# Patient Record
Sex: Female | Born: 2000 | Hispanic: Yes | Marital: Single | Smoking: Never smoker
Health system: Southern US, Community
[De-identification: ages and names within clinical notes are randomized; demographics above are authoritative.]

---
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
## 2020-08-02 ENCOUNTER — Encounter: Payer: Self-pay | Admitting: Emergency Medicine

## 2020-08-02 ENCOUNTER — Other Ambulatory Visit: Payer: Self-pay

## 2020-08-02 DIAGNOSIS — Z20822 Contact with and (suspected) exposure to covid-19: Secondary | ICD-10-CM | POA: Insufficient documentation

## 2020-08-02 DIAGNOSIS — M5441 Lumbago with sciatica, right side: Secondary | ICD-10-CM | POA: Insufficient documentation

## 2020-08-02 LAB — URINALYSIS, COMPLETE (UACMP) WITH MICROSCOPIC
Bacteria, UA: NONE SEEN
Bilirubin Urine: NEGATIVE
Glucose, UA: NEGATIVE mg/dL
Hgb urine dipstick: NEGATIVE
Ketones, ur: 5 mg/dL — AB
Leukocytes,Ua: NEGATIVE
Nitrite: NEGATIVE
Protein, ur: NEGATIVE mg/dL
Specific Gravity, Urine: 1.034 — ABNORMAL HIGH (ref 1.005–1.030)
pH: 5 (ref 5.0–8.0)

## 2020-08-02 LAB — POC URINE PREG, ED: Preg Test, Ur: NEGATIVE

## 2020-08-02 NOTE — ED Triage Notes (Signed)
Patient ambulatory to triage with steady gait, without difficulty or distress noted; pt reports lower back pain radiating down rt leg for last few mos after "bending over"

## 2020-08-03 ENCOUNTER — Emergency Department: Payer: Self-pay

## 2020-08-03 ENCOUNTER — Emergency Department
Admission: EM | Admit: 2020-08-03 | Discharge: 2020-08-03 | Disposition: A | Discharge status: Home / Self Care | Payer: Self-pay | Attending: Emergency Medicine | Admitting: Emergency Medicine

## 2020-08-03 DIAGNOSIS — M545 Low back pain, unspecified: Secondary | ICD-10-CM

## 2020-08-03 LAB — CBC WITH DIFFERENTIAL/PLATELET
Abs Immature Granulocytes: 0.03 10*3/uL (ref 0.00–0.07)
Basophils Absolute: 0.1 10*3/uL (ref 0.0–0.1)
Basophils Relative: 1 %
Eosinophils Absolute: 0.1 10*3/uL (ref 0.0–0.5)
Eosinophils Relative: 1 %
HCT: 39.9 % (ref 36.0–46.0)
Hemoglobin: 13.7 g/dL (ref 12.0–15.0)
Immature Granulocytes: 0 %
Lymphocytes Relative: 32 %
Lymphs Abs: 3 10*3/uL (ref 0.7–4.0)
MCH: 30.9 pg (ref 26.0–34.0)
MCHC: 34.3 g/dL (ref 30.0–36.0)
MCV: 89.9 fL (ref 80.0–100.0)
Monocytes Absolute: 0.7 10*3/uL (ref 0.1–1.0)
Monocytes Relative: 8 %
Neutro Abs: 5.3 10*3/uL (ref 1.7–7.7)
Neutrophils Relative %: 58 %
Platelets: 303 10*3/uL (ref 150–400)
RBC: 4.44 MIL/uL (ref 3.87–5.11)
RDW: 12.6 % (ref 11.5–15.5)
WBC: 9.2 10*3/uL (ref 4.0–10.5)
nRBC: 0 % (ref 0.0–0.2)

## 2020-08-03 LAB — BASIC METABOLIC PANEL
Anion gap: 10 (ref 5–15)
BUN: 16 mg/dL (ref 6–20)
CO2: 25 mmol/L (ref 22–32)
Calcium: 10 mg/dL (ref 8.9–10.3)
Chloride: 101 mmol/L (ref 98–111)
Creatinine, Ser: 0.64 mg/dL (ref 0.44–1.00)
GFR, Estimated: >60 mL/min (ref >60)
Glucose, Bld: 109 mg/dL — ABNORMAL HIGH (ref 70–99)
Potassium: 3.7 mmol/L (ref 3.5–5.1)
Sodium: 136 mmol/L (ref 135–145)

## 2020-08-03 LAB — RESP PANEL BY RT-PCR (FLU A&B, COVID) ARPGX2
Influenza A by PCR: NEGATIVE
Influenza B by PCR: NEGATIVE
SARS Coronavirus 2 by RT PCR: NEGATIVE

## 2020-08-03 MED ORDER — METHOCARBAMOL 500 MG PO TABS: 500.0000 mg | ORAL_TABLET | Freq: Three times a day (TID) | ORAL | 0 refills | Status: AC | PRN

## 2020-08-03 MED ORDER — GADOBUTROL 1 MMOL/ML IV SOLN
7.5000 mL | Freq: Once | INTRAVENOUS | Status: AC | PRN
  Administered 2020-08-03: 7.5 mL via INTRAVENOUS

## 2020-08-03 MED ORDER — ONDANSETRON HCL 4 MG/2ML IJ SOLN
4.0000 mg | Freq: Once | INTRAMUSCULAR | Status: AC
  Administered 2020-08-03: 4 mg via INTRAVENOUS
  Filled 2020-08-03: qty 2

## 2020-08-03 MED ORDER — MORPHINE SULFATE (PF) 4 MG/ML IV SOLN
4.0000 mg | Freq: Once | INTRAVENOUS | Status: AC
  Administered 2020-08-03: 4 mg via INTRAVENOUS
  Filled 2020-08-03: qty 1

## 2020-08-03 MED ORDER — KETOROLAC TROMETHAMINE 30 MG/ML IJ SOLN
30.0000 mg | Freq: Once | INTRAMUSCULAR | Status: AC
  Administered 2020-08-03: 30 mg via INTRAVENOUS
  Filled 2020-08-03: qty 1

## 2020-08-03 MED ORDER — IBUPROFEN 800 MG PO TABS: 800.0000 mg | ORAL_TABLET | Freq: Three times a day (TID) | ORAL | 0 refills | Status: DC | PRN

## 2020-08-03 NOTE — Discharge Instructions (Addendum)
Your blood work, urine, COVID and flu swabs were reassuring today.  Your MRI did show some very mild degenerative changes in your lower back but no other acute abnormality.  Recommend close follow-up with your primary care doctor if symptoms continue.  You may alternate heat and ice to this area.  You may use Salonpas patches over-the-counter to help with discomfort.  We are also discharging you with muscle relaxers and anti-inflammatories which you may take as needed for pain control.

## 2020-08-03 NOTE — ED Provider Notes (Signed)
[facility] Emergency Department Provider Note  ____________________________________________   Event Date/Time   First MD Initiated Contact with Patient 08/03/20 0001     (approximate)  I have reviewed the triage vital signs and the nursing notes.   HISTORY  Chief Complaint Back Pain    HPI Kayla Wolfe is a 20 y.o. female with no significant past medical history who presents to the emergency department with complaints of sharp, severe right lower back pain that radiates down her right leg has been intermittent since May.  She denies any known injury.  States tonight pain was significantly worse and she had an episode of urinary incontinence when trying to get up out of bed.  She is not sure if this was due to pain but states she kept urinating and could not get it to stop.  No bowel incontinence.  States over the past 2 to 3 days she has had fever as high as 100.4.  She denies any other infectious symptoms.  No headache, neck pain or neck stiffness, chest pain or shortness of breath, nausea, vomiting or diarrhea, dysuria or hematuria, vaginal bleeding or discharge.  No tick bites.  Has had a mild cough.  Denies history of previous back surgeries or epidural injections.  No history of HIV, diabetes, cancer or IV drug abuse.  Pain worse with movement.  Has been taking ibuprofen with some relief intermittently.      History reviewed. No pertinent past medical history.  There are no problems to display for this patient.   History reviewed. No pertinent surgical history.  Prior to Admission medications   Medication Sig Start Date End Date Taking? Authorizing Provider  ibuprofen (ADVIL) 800 MG tablet Take 1 tablet (800 mg total) by mouth every 8 (eight) hours as needed for mild pain. 08/03/20  Yes Randle Shatzer, Layla Maw, DO  methocarbamol (ROBAXIN) 500 MG tablet Take 1 tablet (500 mg total) by mouth every 8 (eight) hours as needed for muscle spasms. 08/03/20  Yes  Dameon Soltis, Layla Maw, DO    Allergies Patient has no known allergies.  No family history on file.  Social History Social History   Tobacco Use   Smoking status: Never   Smokeless tobacco: Never  Vaping Use   Vaping Use: Never used  Substance Use Topics   Alcohol use: Never   Drug use: Never    Review of Systems Constitutional: + fever. Eyes: No visual changes. ENT: No sore throat. Cardiovascular: Denies chest pain. Respiratory: Denies shortness of breath. Gastrointestinal: No nausea, vomiting, diarrhea. Genitourinary: Negative for dysuria. Musculoskeletal: + for back pain. Skin: Negative for rash. Neurological: Negative for focal weakness or numbness.  ____________________________________________   PHYSICAL EXAM:  VITAL SIGNS: ED Triage Vitals  Enc Vitals Group     BP 08/02/20 2313 (!) 145/82     Pulse Rate 08/02/20 2313 (!) 110     Resp 08/02/20 2313 18     Temp 08/02/20 2313 97.9 F (36.6 C)     Temp Source 08/02/20 2313 Oral     SpO2 08/02/20 2313 99 %     Weight 08/02/20 2317 180 lb (81.6 kg)     Height 08/02/20 2317 5\' 1"  (1.549 m)     Head Circumference --      Peak Flow --      Pain Score 08/02/20 2317 8     Pain Loc --      Pain Edu? --      Excl.  in GC? --    CONSTITUTIONAL: Alert and oriented and responds appropriately to questions.  Appears uncomfortable, afebrile, nontoxic HEAD: Normocephalic EYES: Conjunctivae clear, pupils appear equal, EOM appear intact ENT: normal nose; moist mucous membranes NECK: Supple, normal ROM CARD: Regular and tachycardic; S1 and S2 appreciated; no murmurs, no clicks, no rubs, no gallops RESP: Normal chest excursion without splinting or tachypnea; breath sounds clear and equal bilaterally; no wheezes, no rhonchi, no rales, no hypoxia or respiratory distress, speaking full sentences ABD/GI: Normal bowel sounds; non-distended; soft, non-tender, no rebound, no guarding, no peritoneal signs, no hepatosplenomegaly BACK:  The back appears normal, tender to palpation over the mid lower lumbar spine as well as the right paraspinal muscles without redness, warmth, soft tissue swelling, ecchymosis, rash or other lesions EXT: Normal ROM in all joints; no deformity noted, no edema; no cyanosis SKIN: Normal color for age and race; warm; no rash on exposed skin NEURO: Moves all extremities equally, normal sensation diffusely, no saddle anesthesia, 2+ deep tendon reflexes in bilateral upper and lower extremities, no clonus PSYCH: The patient's mood and manner are appropriate.  ____________________________________________   LABS (all labs ordered are listed, but only abnormal results are displayed)  Labs Reviewed  URINALYSIS, COMPLETE (UACMP) WITH MICROSCOPIC - Abnormal; Notable for the following components:      Result Value   Color, Urine YELLOW (*)    APPearance HAZY (*)    Specific Gravity, Urine 1.034 (*)    Ketones, ur 5 (*)    All other components within normal limits  BASIC METABOLIC PANEL - Abnormal; Notable for the following components:   Glucose, Bld 109 (*)    All other components within normal limits  RESP PANEL BY RT-PCR (FLU A&B, COVID) ARPGX2  CBC WITH DIFFERENTIAL/PLATELET  POC URINE PREG, ED   ____________________________________________  EKG   ____________________________________________  RADIOLOGY I, Lilyann Gravelle, personally viewed and evaluated these images (plain radiographs) as part of my medical decision making, as well as reviewing the written report by the radiologist.  ED MD interpretation: No cauda equina.  No sign of infection.  Official radiology report(s): MR Lumbar Spine W Wo Contrast  Result Date: 08/03/2020 CLINICAL DATA:  Low back pain, cauda equina syndrome suspected Low back pain, infection suspected EXAM: MRI LUMBAR SPINE WITHOUT AND WITH CONTRAST TECHNIQUE: Multiplanar and multiecho pulse sequences of the lumbar spine were obtained without and with intravenous  contrast. CONTRAST:  7.81mL GADAVIST GADOBUTROL 1 MMOL/ML IV SOLN COMPARISON:  None. FINDINGS: Segmentation:  Standard. Alignment:  Physiologic. Vertebrae:  No fracture, evidence of discitis, or bone lesion. Conus medullaris and cauda equina: Conus extends to the L1-2 level. Conus and cauda equina appear normal. Paraspinal and other soft tissues: Negative Disc levels: At L4-5, there is a small central disc protrusion. No spinal canal or neural foraminal stenosis. At L5-S1, there is mild facet hypertrophy. The other lumbar disc levels are normal. No abnormal contrast enhancement. IMPRESSION: 1. No cauda equina compression. 2. Mild lower lumbar degenerative disc disease without spinal canal or neural foraminal stenosis. Electronically Signed   By: Deatra Robinson M.D.   On: 08/03/2020 01:28    ____________________________________________   PROCEDURES  Procedure(s) performed (including Critical Care):  Procedures    ____________________________________________   INITIAL IMPRESSION / ASSESSMENT AND PLAN / ED COURSE  As part of my medical decision making, I reviewed the following data within the electronic MEDICAL RECORD NUMBER Nursing notes reviewed and incorporated, Labs reviewed , Old chart reviewed, Radiograph reviewed ,  and Notes from prior ED visits         Patient here with complaints of back pain.  Likely radiculopathy but patient states she has had fever for the past 2 to 3 days as well as an episode of urinary incontinence tonight although it does not sound like overflow incontinence.  Will obtain MRI of the lumbar spine with and without contrast to evaluate for possible cauda equina, spinal stenosis, epidural abscess or hematoma, discitis or osteomyelitis.  Will give pain medication.  Will obtain labs, COVID swab.  Urine unremarkable.  Urine pregnancy test negative.  Doubt UTI, pyelonephritis, kidney stone.  ED PROGRESS  Patient's labs reassuring today.  COVID and flu negative.  MRI shows  mild degenerative changes but no spinal stenosis, cauda equina, epidural abscess or hematoma, discitis or osteomyelitis, foraminal stenosis.  Will discharge with ibuprofen, Robaxin.  She does have a PCP for follow-up.  Patient reports feeling better and is comfortable with this plan.  Will provide with work note.  At this time, I do not feel there is any life-threatening condition present. I have reviewed, interpreted and discussed all results (EKG, imaging, lab, urine as appropriate) and exam findings with patient/family. I have reviewed nursing notes and appropriate previous records.  I feel the patient is safe to be discharged home without further emergent workup and can continue workup as an outpatient as needed. Discussed usual and customary return precautions. Patient/family verbalize understanding and are comfortable with this plan.  Outpatient follow-up has been provided as needed. All questions have been answered.  ____________________________________________   FINAL CLINICAL IMPRESSION(S) / ED DIAGNOSES  Final diagnoses:  Low back pain radiating to right lower extremity     ED Discharge Orders          Ordered    methocarbamol (ROBAXIN) 500 MG tablet  Every 8 hours PRN        08/03/20 0211    ibuprofen (ADVIL) 800 MG tablet  Every 8 hours PRN        08/03/20 0211            *Please note:  Luanna Cole A Mardi Mainland was evaluated in Emergency Department on 08/03/2020 for the symptoms described in the history of present illness. She was evaluated in the context of the global COVID-19 pandemic, which necessitated consideration that the patient might be at risk for infection with the SARS-CoV-2 virus that causes COVID-19. Institutional protocols and algorithms that pertain to the evaluation of patients at risk for COVID-19 are in a state of rapid change based on information released by regulatory bodies including the CDC and federal and state organizations. These policies and algorithms  were followed during the patient's care in the ED.  Some ED evaluations and interventions may be delayed as a result of limited staffing during and the pandemic.*   Note:  This document was prepared using Dragon voice recognition software and may include unintentional dictation errors.    Mycal Conde, Layla Maw, DO 08/03/20 [phone]

## 2020-11-30 ENCOUNTER — Ambulatory Visit
Admission: EM | Admit: 2020-11-30 | Discharge: 2020-11-30 | Disposition: A | Discharge status: Home / Self Care | Payer: Self-pay | Attending: Emergency Medicine | Admitting: Emergency Medicine

## 2020-11-30 ENCOUNTER — Encounter: Payer: Self-pay | Admitting: Emergency Medicine

## 2020-11-30 ENCOUNTER — Other Ambulatory Visit: Payer: Self-pay

## 2020-11-30 DIAGNOSIS — J069 Acute upper respiratory infection, unspecified: Secondary | ICD-10-CM | POA: Insufficient documentation

## 2020-11-30 LAB — RAPID INFLUENZA A&B ANTIGENS
Influenza A (ARMC): NEGATIVE
Influenza B (ARMC): NEGATIVE

## 2020-11-30 MED ORDER — CETIRIZINE HCL 10 MG PO TABS: 10.0000 mg | ORAL_TABLET | Freq: Every day | ORAL | 0 refills | Status: AC

## 2020-11-30 MED ORDER — IBUPROFEN 800 MG PO TABS: 800.0000 mg | ORAL_TABLET | Freq: Three times a day (TID) | ORAL | 0 refills | Status: AC

## 2020-11-30 MED ORDER — LIDOCAINE VISCOUS HCL 2 % MT SOLN
15.0000 mL | OROMUCOSAL | 0 refills | Status: AC | PRN
Start: 2020-11-30 — End: ?

## 2020-11-30 NOTE — ED Provider Notes (Signed)
[facility]    CSN: [id number] Arrival date & time: 11/30/20  1331      History   Chief Complaint Chief Complaint  Patient presents with   Headache   Fever   Cough    HPI Kayla Wolfe is a 20 y.o. female.   Patient presents with fever, nasal congestion, rhinorrhea, sore throat, nonproductive cough and intermittent generalized headaches for day 4. Poor appetite, tolerating fluid. 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 Has attempted use of dayquil, tylenol, minimal relief.  Denies ear pain, abdominal pain, nausea, vomiting, diarrhea, shortness of breath, wheezing.     History reviewed. No pertinent past medical history.  There are no problems to display for this patient.   History reviewed. No pertinent surgical history.  OB History   No obstetric history on file.      Home Medications    Prior to Admission medications   Medication Sig Start Date End Date Taking? Authorizing Provider  ibuprofen (ADVIL) 800 MG tablet Take 1 tablet (800 mg total) by mouth every 8 (eight) hours as needed for mild pain. 08/03/20   Ward, Layla Maw, DO  methocarbamol (ROBAXIN) 500 MG tablet Take 1 tablet (500 mg total) by mouth every 8 (eight) hours as needed for muscle spasms. 08/03/20   Ward, Layla Maw, DO    Family History History reviewed. No pertinent family history.  Social History Social History   Tobacco Use   Smoking status: Never   Smokeless tobacco: Never  Vaping Use   Vaping Use: Never used  Substance Use Topics   Alcohol use: Never   Drug use: Never     Allergies   Patient has no known allergies.   Review of Systems Review of Systems  Constitutional:  Positive for appetite change and fever. Negative for activity change, chills, diaphoresis, fatigue and unexpected weight change.  HENT:  Positive for congestion, rhinorrhea and sore throat. Negative for dental problem, drooling, ear discharge, ear pain, facial swelling, hearing loss,  mouth sores, nosebleeds, postnasal drip, sinus pressure, sinus pain, sneezing, tinnitus, trouble swallowing and voice change.   Respiratory:  Positive for cough. Negative for apnea, choking, chest tightness, shortness of breath, wheezing and stridor.   Cardiovascular: Negative.   Gastrointestinal: Negative.   Skin: Negative.   Neurological:  Positive for headaches. Negative for dizziness, tremors, seizures, syncope, facial asymmetry, speech difficulty, weakness, light-headedness and numbness.    Physical Exam Triage Vital Signs ED Triage Vitals  Enc Vitals Group     BP 11/30/20 1435 136/74     Pulse Rate 11/30/20 1435 73     Resp 11/30/20 1435 16     Temp 11/30/20 1435 98.4 F (36.9 C)     Temp Source 11/30/20 1435 Oral     SpO2 11/30/20 1435 100 %     Weight --      Height --      Head Circumference --      Peak Flow --      Pain Score 11/30/20 1436 5     Pain Loc --      Pain Edu? --      Excl. in [place]? --    No data found.  Updated Vital Signs BP 136/74 (BP Location: Left Arm)   Pulse 73   Temp 98.4 F (36.9 C) (Oral)   Resp 16   LMP 11/07/2020   SpO2 100%   Visual Acuity Right Eye Distance:   Left Eye Distance:   Bilateral  Distance:    Right Eye Near:   Left Eye Near:    Bilateral Near:     Physical Exam Constitutional:      Appearance: Normal appearance. She is normal weight.  HENT:     Head: Normocephalic.     Right Ear: Tympanic membrane, ear canal and external ear normal.     Left Ear: Tympanic membrane, ear canal and external ear normal.     Nose: Congestion and rhinorrhea present.     Mouth/Throat:     Mouth: Mucous membranes are moist.     Pharynx: Posterior oropharyngeal erythema present.  Eyes:     Extraocular Movements: Extraocular movements intact.  Cardiovascular:     Rate and Rhythm: Normal rate and regular rhythm.     Pulses: Normal pulses.     Heart sounds: Normal heart sounds.  Pulmonary:     Effort: Pulmonary effort is normal.      Breath sounds: Normal breath sounds.  Musculoskeletal:     Cervical back: Normal range of motion.  Lymphadenopathy:     Cervical: Cervical adenopathy present.  Skin:    General: Skin is warm and dry.  Neurological:     Mental Status: She is alert and oriented to person, place, and time. Mental status is at baseline.  Psychiatric:        Mood and Affect: Mood normal.        Behavior: Behavior normal.     UC Treatments / Results  Labs (all labs ordered are listed, but only abnormal results are displayed) Labs Reviewed - No data to display  EKG   Radiology No results found.  Procedures Procedures (including critical care time)  Medications Ordered in UC Medications - No data to display  Initial Impression / Assessment and Plan / UC Course  I have reviewed the triage vital signs and the nursing notes.  Pertinent labs & imaging results that were available during my care of the patient were reviewed by me and considered in my medical decision making (see chart for details).  Viral URI with cough  1.  Flu test pending 2.  Ibuprofen 800 mg 3 times daily as needed 3.Cetirizine 10 mg daily 4.  Lidocaine viscous 2% 15 mils every 4 hours as needed 5.  Over-the-counter medication management for remaining symptom management 6.  Work note given 7.  Urgent care follow-up as needed Final Clinical Impressions(s) / UC Diagnoses   Final diagnoses:  None   Discharge Instructions   None    ED Prescriptions   None    PDMP not reviewed this encounter.   Valinda Hoar, NP 11/30/20 1544

## 2020-11-30 NOTE — ED Triage Notes (Signed)
Headache, cough, fever, sore throat x 6 days

## 2020-11-30 NOTE — Discharge Instructions (Signed)
We will contact you if your flu test is positive.  Please quarantine while you wait for the results.  If your test is negative you may resume normal activities.  If your test is positive please continue to quarantine until you are without a fever for at least 24 hours after the medications.    You can take Tylenol and/or Ibuprofen as needed for fever reduction and pain relief.   For cough: honey 1/2 to 1 teaspoon (you can dilute the honey in water or another fluid).  You can also use guaifenesin and dextromethorphan for cough. You can use a humidifier for chest congestion and cough.  If you don't have a humidifier, you can sit in the bathroom with the hot shower running.      For sore throat: try warm salt water gargles, cepacol lozenges, throat spray, warm tea or water with lemon/honey, popsicles or ice, or OTC cold relief medicine for throat discomfort.   For congestion: take a daily anti-histamine like Zyrtec, Claritin, and a oral decongestant, such as pseudoephedrine.  You can also use Flonase 1-2 sprays in each nostril daily.   It is important to stay hydrated: drink plenty of fluids (water, gatorade/powerade/pedialyte, juices, or teas) to keep your throat moisturized and help further relieve irritation/discomfort.   

## 2021-01-15 ENCOUNTER — Encounter: Payer: Self-pay | Admitting: Emergency Medicine

## 2021-01-15 ENCOUNTER — Other Ambulatory Visit: Payer: Self-pay

## 2021-01-15 ENCOUNTER — Ambulatory Visit
Admission: EM | Admit: 2021-01-15 | Discharge: 2021-01-15 | Disposition: A | Discharge status: Home / Self Care | Payer: Self-pay | Attending: Physician Assistant | Admitting: Physician Assistant

## 2021-01-15 DIAGNOSIS — R110 Nausea: Secondary | ICD-10-CM | POA: Insufficient documentation

## 2021-01-15 LAB — PREGNANCY, URINE: Preg Test, Ur: NEGATIVE

## 2021-01-15 LAB — HCG, QUANTITATIVE, PREGNANCY: hCG, Beta Chain, Quant, S: <1 m[IU]/mL (ref <5)

## 2021-01-15 MED ORDER — ONDANSETRON HCL 4 MG PO TABS: 4.0000 mg | ORAL_TABLET | Freq: Four times a day (QID) | ORAL | 0 refills | Status: AC

## 2021-01-15 NOTE — ED Provider Notes (Signed)
[facility]    CSN: [id number] Arrival date & time: 01/15/21  1246      History   Chief Complaint Chief Complaint  Patient presents with   Nausea    HPI Mina Batdorf Fabiola Corti is a 21 y.o. female.   Patient states that she has had an irregular period for the past month.  She has had nausea for 3 weeks now and wanted to have a pregnancy test completed.  Patient took several COVID test thinking she may have gotten illness which all 3 came back negative.  Patient denies any cough congestion no abdominal pain no fevers.  She was wondering if maybe she could be early with pregnancy if the test is negative.  Patient also states that her bowel movements are not normal and she is not sure if this is a cause or not.   History reviewed. No pertinent past medical history.  There are no problems to display for this patient.   History reviewed. No pertinent surgical history.  OB History   No obstetric history on file.      Home Medications    Prior to Admission medications   Medication Sig Start Date End Date Taking? Authorizing Provider  ondansetron (ZOFRAN) 4 MG tablet Take 1 tablet (4 mg total) by mouth every 6 (six) hours. 01/15/21  Yes Marney Setting, NP  cetirizine (ZYRTEC ALLERGY) 10 MG tablet Take 1 tablet (10 mg total) by mouth daily. 11/30/20   White, Leitha Schuller, NP  ibuprofen (ADVIL) 800 MG tablet Take 1 tablet (800 mg total) by mouth 3 (three) times daily. 11/30/20   White, Leitha Schuller, NP  lidocaine (XYLOCAINE) 2 % solution Use as directed 15 mLs in the mouth or throat as needed for mouth pain. 11/30/20   White, Leitha Schuller, NP  methocarbamol (ROBAXIN) 500 MG tablet Take 1 tablet (500 mg total) by mouth every 8 (eight) hours as needed for muscle spasms. 08/03/20   Ward, Delice Bison, DO    Family History No family history on file.  Social History Social History   Tobacco Use   Smoking status: Never   Smokeless tobacco: Never  Vaping Use   Vaping Use:  Never used  Substance Use Topics   Alcohol use: Never   Drug use: Never     Allergies   Patient has no known allergies.   Review of Systems Review of Systems  Constitutional:  Negative for fatigue and fever.  Eyes: Negative.   Respiratory: Negative.    Cardiovascular: Negative.   Gastrointestinal:  Positive for constipation and nausea. Negative for abdominal pain, diarrhea, rectal pain and vomiting.  Genitourinary:  Positive for menstrual problem. Negative for difficulty urinating, dysuria, flank pain, frequency and hematuria.       Menstrual cycle only lasted for 2 days normal is approximately 5 days  Neurological: Negative.     Physical Exam Triage Vital Signs ED Triage Vitals  Enc Vitals Group     BP 01/15/21 1353 (!) 137/95     Pulse Rate 01/15/21 1353 73     Resp 01/15/21 1353 16     Temp 01/15/21 1353 99.1 F (37.3 C)     Temp Source 01/15/21 1353 Oral     SpO2 01/15/21 1353 100 %     Weight --      Height --      Head Circumference --      Peak Flow --      Pain Score 01/15/21 1352 0  Pain Loc --      Pain Edu? --      Excl. in [place]? --    No data found.  Updated Vital Signs BP (!) 137/95    Pulse 73    Temp 99.1 F (37.3 C) (Oral)    Resp 16    LMP 01/10/2021    SpO2 100%   Visual Acuity Right Eye Distance:   Left Eye Distance:   Bilateral Distance:    Right Eye Near:   Left Eye Near:    Bilateral Near:     Physical Exam Constitutional:      Appearance: Normal appearance.  Cardiovascular:     Rate and Rhythm: Normal rate.  Pulmonary:     Effort: Pulmonary effort is normal.  Abdominal:     General: Abdomen is flat.     Tenderness: There is no right CVA tenderness or left CVA tenderness.  Musculoskeletal:        General: Normal range of motion.  Skin:    General: Skin is warm.     Capillary Refill: Capillary refill takes less than 2 seconds.  Neurological:     General: No focal deficit present.     Mental Status: She is alert.      UC Treatments / Results  Labs (all labs ordered are listed, but only abnormal results are displayed) Labs Reviewed  PREGNANCY, URINE  HCG, QUANTITATIVE, PREGNANCY    EKG   Radiology No results found.  Procedures Procedures (including critical care time)  Medications Ordered in UC Medications - No data to display  Initial Impression / Assessment and Plan / UC Course  I have reviewed the triage vital signs and the nursing notes.  Pertinent labs & imaging results that were available during my care of the patient were reviewed by me and considered in my medical decision making (see chart for details).     Discussed with patient that should try a Dulcolax to ensure that constipation is not the cause of the nausea. We will send off an hCG G level just to rule out that it is not an early pregnancy. Take nausea medicine as needed if this continues he will need to follow-up with a gastro enterologist. Avoid spicy foods or caffeine which can irritate the stomach even more Your urine pregnancy was negative Final Clinical Impressions(s) / UC Diagnoses   Final diagnoses:  Nausea     Discharge Instructions      Discussed with patient that should try a Dulcolax to ensure that constipation is not the cause of the nausea. We will send off an hCG G level just to rule out that it is not an early pregnancy. Take nausea medicine as needed if this continues he will need to follow-up with a gastro enterologist. Avoid spicy foods or caffeine which can irritate the stomach even more     ED Prescriptions     Medication Sig Dispense Auth. Provider   ondansetron (ZOFRAN) 4 MG tablet Take 1 tablet (4 mg total) by mouth every 6 (six) hours. 12 tablet Marney Setting, NP      PDMP not reviewed this encounter.   Marney Setting, NP 01/15/21 1430

## 2021-01-15 NOTE — Discharge Instructions (Addendum)
Discussed with patient that should try a Dulcolax to ensure that constipation is not the cause of the nausea. We will send off an hCG G level just to rule out that it is not an early pregnancy. Take nausea medicine as needed if this continues he will need to follow-up with a gastro enterologist. Avoid spicy foods or caffeine which can irritate the stomach even more

## 2021-01-15 NOTE — ED Triage Notes (Signed)
PT reports nausea for 3 weeks. She had a negative pregnancy test 1 week ago.  Also had negative covid.   Notes that her last menstrual cycle lasted for 2 days, which is very unusual for her.

## 2022-11-27 IMAGING — MR MR LUMBAR SPINE WO/W CM
6 of 7 series · 31 of 48 positions shown · IV contrast (7.5ml Gadavist)
Comparison: None.

CLINICAL DATA: Low back pain, cauda equina syndrome suspected Low
back pain, infection suspected

EXAM:
MRI LUMBAR SPINE WITHOUT AND WITH CONTRAST
TECHNIQUE: Multiplanar and multiecho pulse sequences of the lumbar spine were
obtained without and with intravenous contrast.
CONTRAST:  7.5mL GADAVIST GADOBUTROL 1 MMOL/ML IV SOLN

[Series 9: T2 · sagittal · 4.0mm · 0.81mm/px · 4 of 17 slices shown (1 of 2)]
[im 1/17]
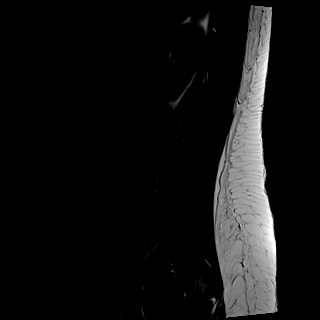
[im 6/17]
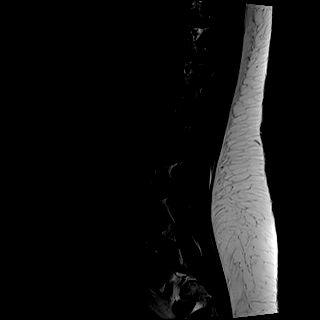
[im 11/17]
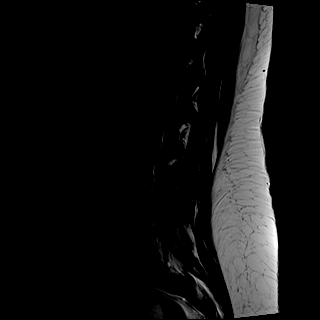
[im 17/17]
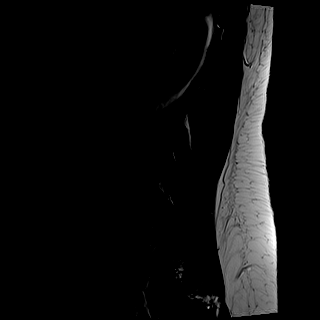

[Series 10: T1 · sagittal · 4.0mm · 0.81mm/px · 4 of 17 slices shown (1 of 2)]
[im 1/17]
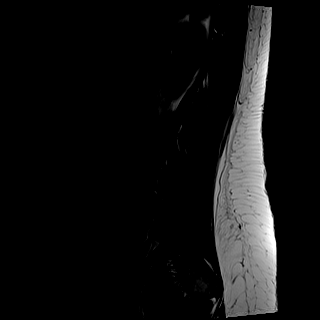
[im 6/17]
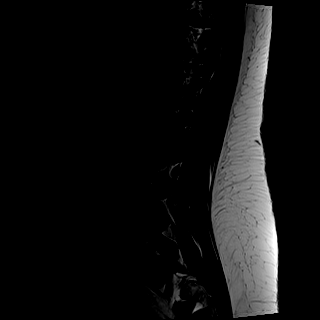
[im 11/17]
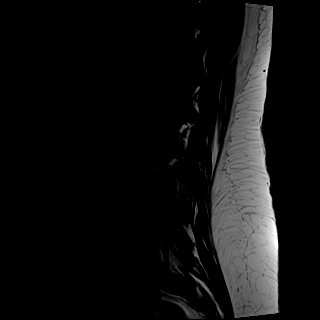
[im 17/17]
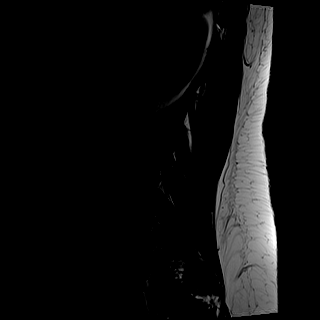

[Series 11: STIR · sagittal · 4.0mm · 0.41mm/px · 2 of 17 slices shown]
[im 1/17]
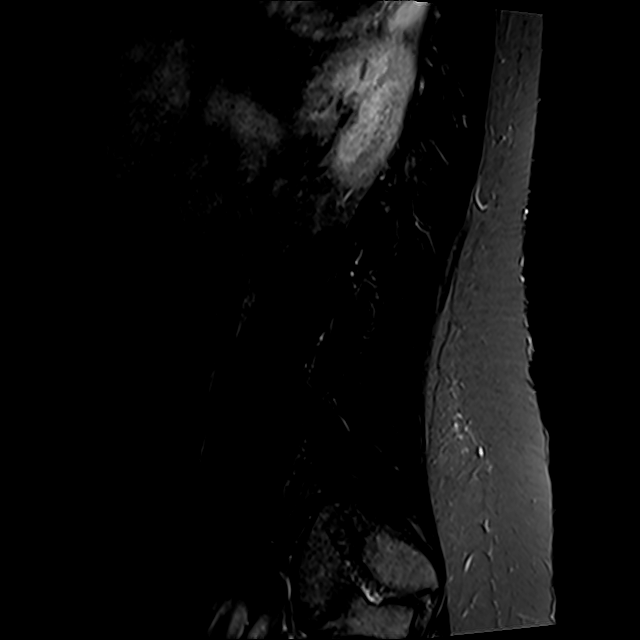
[im 5/17]
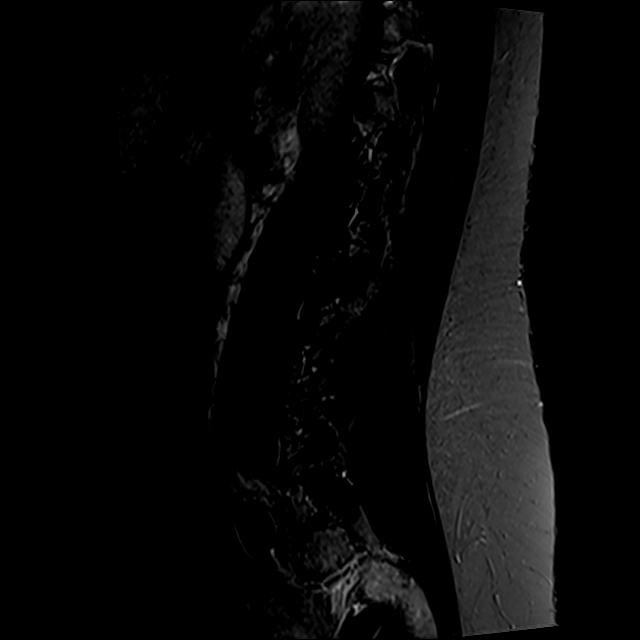

[Series 12: T2 · axial · 4.0mm · 0.78mm/px · z∈[-23,+175]mm · 8 of 36 slices shown (2 of 2)]
[im 1/36]
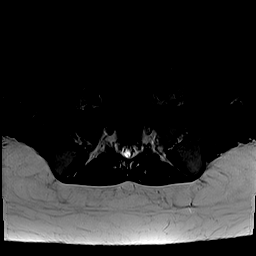
[im 4/36]
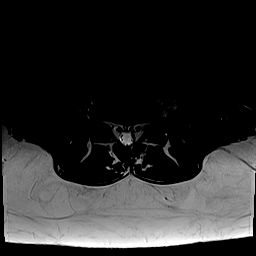
[im 12/36]
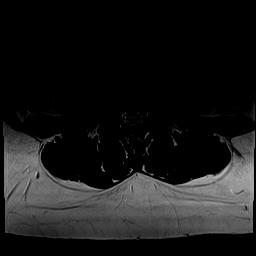
[im 16/36]
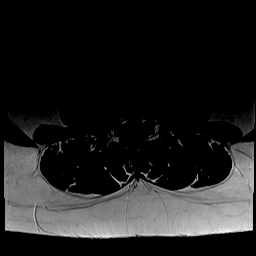
[im 20/36]
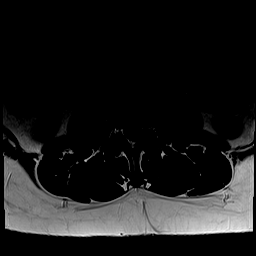
[im 24/36]
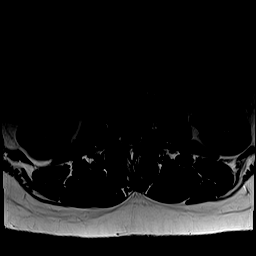
[im 32/36]
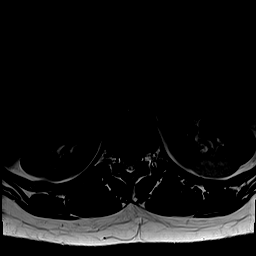
[im 36/36]
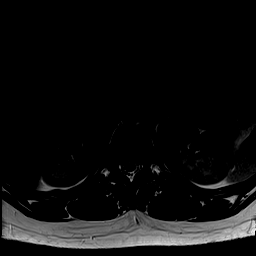

[Series 13: T1 · axial · 4.0mm · 0.39mm/px · z∈[-23,+175]mm · 8 of 36 slices shown (2 of 2)]
[im 1/36]
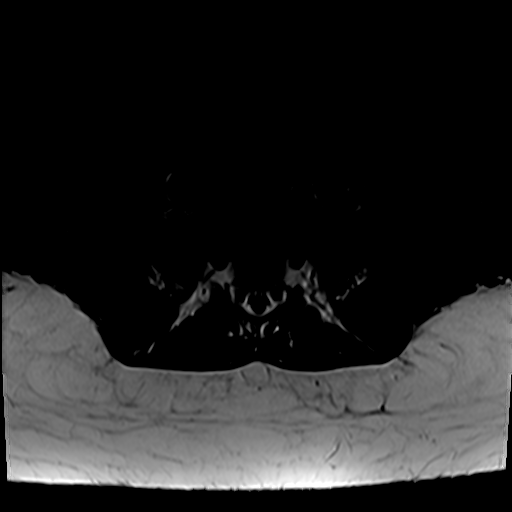
[im 4/36]
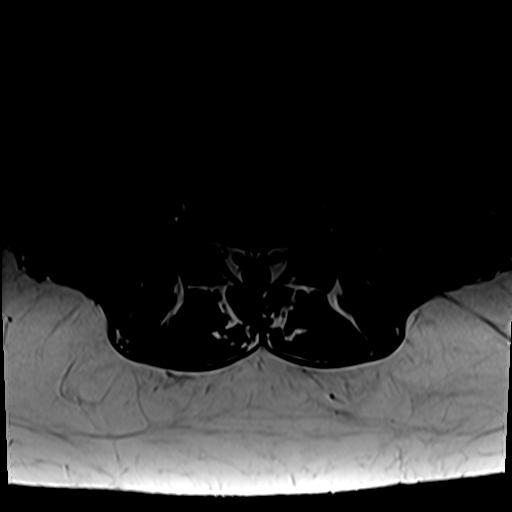
[im 12/36]
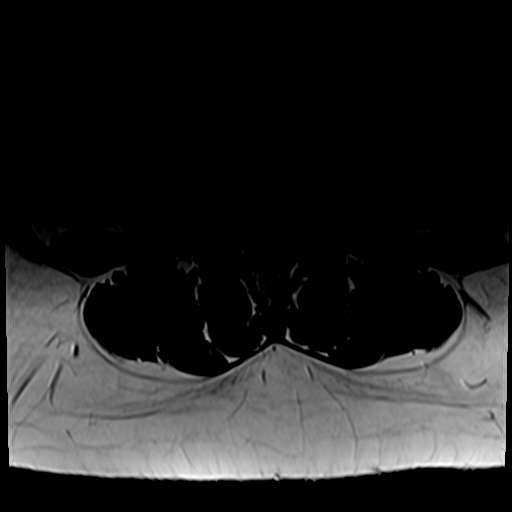
[im 16/36]
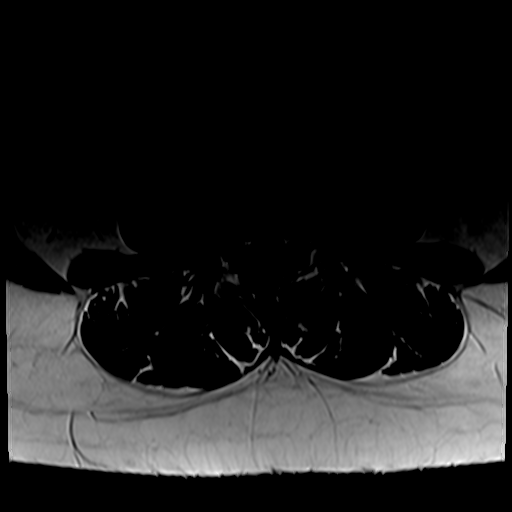
[im 20/36]
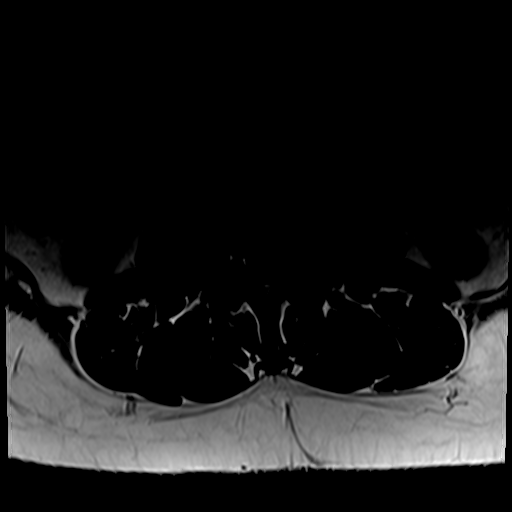
[im 24/36]
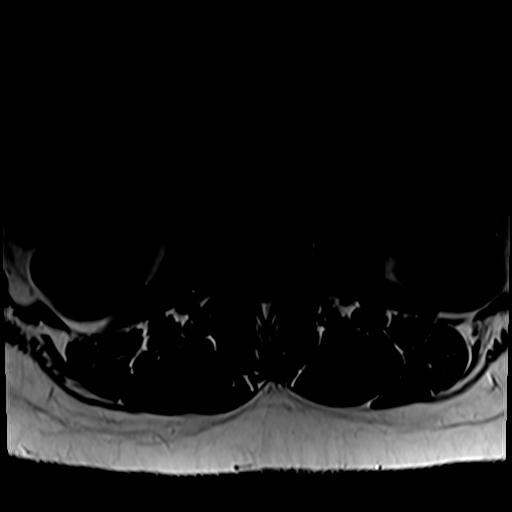
[im 32/36]
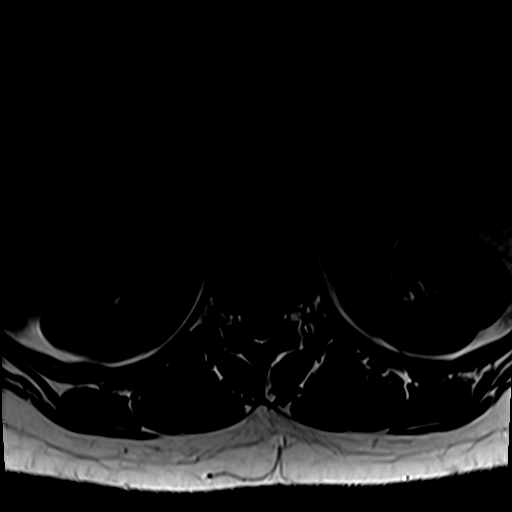
[im 36/36]
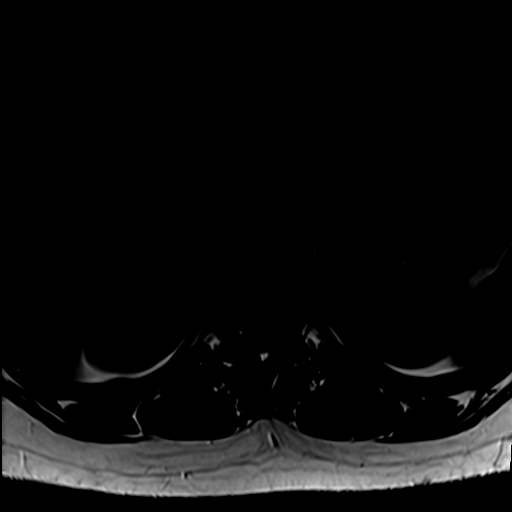

[Series 14: T1 fat-sat post-contrast · sagittal · 4.0mm · 0.81mm/px · 5 of 17 slices shown]
[im 1/17]
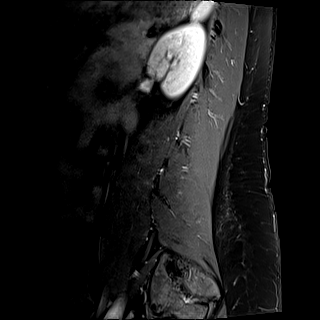
[im 5/17]
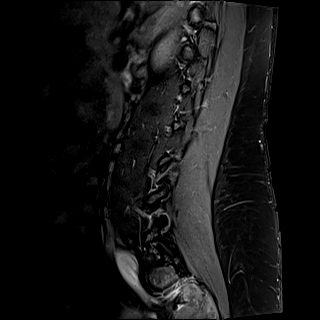
[im 9/17]
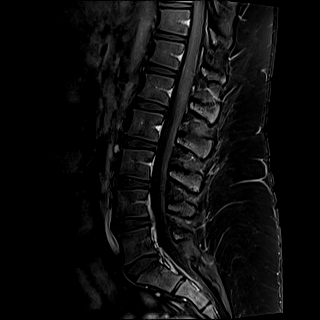
[im 13/17]
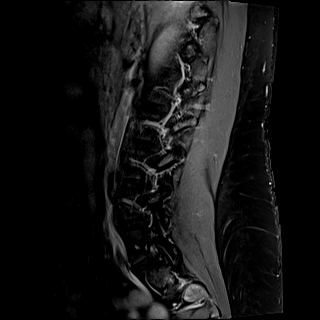
[im 17/17]
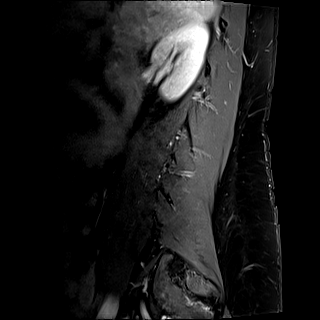

[31 of 48 positions shown; findings below may reference images not displayed]

FINDINGS: Segmentation:  Standard.

Alignment:  Physiologic.

Vertebrae:  No fracture, evidence of discitis, or bone lesion.

Conus medullaris and cauda equina: Conus extends to the L1-2 level.
Conus and cauda equina appear normal.

Paraspinal and other soft tissues: Negative

Disc levels:

At L4-5, there is a small central disc protrusion. No spinal canal
or neural foraminal stenosis.

At L5-S1, there is mild facet hypertrophy.

The other lumbar disc levels are normal.

No abnormal contrast enhancement.
IMPRESSION: 1. No cauda equina compression.
2. Mild lower lumbar degenerative disc disease without spinal canal
or neural foraminal stenosis.
